# Patient Record
Sex: Female | Born: 1975 | Hispanic: No | Smoking: Never smoker
Health system: Southern US, Community
[De-identification: ages and names within clinical notes are randomized; demographics above are authoritative.]

---

## 2000-06-22 ENCOUNTER — Other Ambulatory Visit: Admission: RE | Admit: 2000-06-22 | Discharge: 2000-06-22 | Payer: Self-pay | Admitting: Family Medicine

## 2001-07-13 ENCOUNTER — Other Ambulatory Visit: Admission: RE | Admit: 2001-07-13 | Discharge: 2001-07-13 | Payer: Self-pay | Admitting: Family Medicine

## 2016-11-17 ENCOUNTER — Encounter (HOSPITAL_COMMUNITY): Payer: Self-pay | Admitting: Emergency Medicine

## 2016-11-17 ENCOUNTER — Emergency Department (HOSPITAL_COMMUNITY)
Admission: EM | Admit: 2016-11-17 | Discharge: 2016-11-18 | Disposition: A | Discharge status: Home / Self Care | Payer: No Typology Code available for payment source | Attending: Emergency Medicine | Admitting: Emergency Medicine

## 2016-11-17 ENCOUNTER — Emergency Department (HOSPITAL_COMMUNITY): Payer: No Typology Code available for payment source

## 2016-11-17 DIAGNOSIS — Y999 Unspecified external cause status: Secondary | ICD-10-CM | POA: Diagnosis not present

## 2016-11-17 DIAGNOSIS — S168XXA Other specified injury of muscle, fascia and tendon at neck level, initial encounter: Secondary | ICD-10-CM | POA: Diagnosis present

## 2016-11-17 DIAGNOSIS — S161XXA Strain of muscle, fascia and tendon at neck level, initial encounter: Secondary | ICD-10-CM | POA: Diagnosis not present

## 2016-11-17 DIAGNOSIS — R079 Chest pain, unspecified: Secondary | ICD-10-CM | POA: Diagnosis not present

## 2016-11-17 DIAGNOSIS — R109 Unspecified abdominal pain: Secondary | ICD-10-CM | POA: Insufficient documentation

## 2016-11-17 DIAGNOSIS — T07XXXA Unspecified multiple injuries, initial encounter: Secondary | ICD-10-CM

## 2016-11-17 DIAGNOSIS — Y92410 Unspecified street and highway as the place of occurrence of the external cause: Secondary | ICD-10-CM | POA: Insufficient documentation

## 2016-11-17 DIAGNOSIS — Y939 Activity, unspecified: Secondary | ICD-10-CM | POA: Diagnosis not present

## 2016-11-17 MED ORDER — NAPROXEN 250 MG PO TABS
500.0000 mg | ORAL_TABLET | Freq: Once | ORAL | Status: AC
  Administered 2016-11-17: 500 mg via ORAL
  Filled 2016-11-17: qty 2

## 2016-11-17 MED ORDER — OXYCODONE-ACETAMINOPHEN 5-325 MG PO TABS
1.0000 | ORAL_TABLET | Freq: Once | ORAL | Status: AC
  Administered 2016-11-17: 1 via ORAL

## 2016-11-17 MED ORDER — DIAZEPAM 5 MG PO TABS
5.0000 mg | ORAL_TABLET | Freq: Once | ORAL | Status: AC
  Administered 2016-11-17: 5 mg via ORAL
  Filled 2016-11-17: qty 1

## 2016-11-17 MED ORDER — OXYCODONE-ACETAMINOPHEN 5-325 MG PO TABS
ORAL_TABLET | ORAL | Status: AC
  Filled 2016-11-17: qty 1

## 2016-11-17 MED ORDER — HYDROCODONE-ACETAMINOPHEN 5-325 MG PO TABS
1.0000 | ORAL_TABLET | Freq: Once | ORAL | Status: AC
  Administered 2016-11-17: 1 via ORAL
  Filled 2016-11-17: qty 1
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
## 2016-11-18 ENCOUNTER — Emergency Department (HOSPITAL_COMMUNITY): Payer: No Typology Code available for payment source

## 2016-11-18 LAB — CBC WITH DIFFERENTIAL/PLATELET
Basophils Absolute: 0 K/uL (ref 0.0–0.1)
Basophils Relative: 0 %
Eosinophils Absolute: 0.3 K/uL (ref 0.0–0.7)
Eosinophils Relative: 4 %
HCT: 38.3 % (ref 36.0–46.0)
Hemoglobin: 12.4 g/dL (ref 12.0–15.0)
Lymphocytes Relative: 37 %
Lymphs Abs: 2.8 K/uL (ref 0.7–4.0)
MCH: 27.7 pg (ref 26.0–34.0)
MCHC: 32.4 g/dL (ref 30.0–36.0)
MCV: 85.7 fL (ref 78.0–100.0)
Monocytes Absolute: 0.5 K/uL (ref 0.1–1.0)
Monocytes Relative: 7 %
Neutro Abs: 3.8 K/uL (ref 1.7–7.7)
Neutrophils Relative %: 52 %
Platelets: 238 K/uL (ref 150–400)
RBC: 4.47 MIL/uL (ref 3.87–5.11)
RDW: 14.4 % (ref 11.5–15.5)
WBC: 7.5 K/uL (ref 4.0–10.5)

## 2016-11-18 LAB — COMPREHENSIVE METABOLIC PANEL
ALBUMIN: 3.9 g/dL (ref 3.5–5.0)
ALK PHOS: 77 U/L (ref 38–126)
ALT: 30 U/L (ref 14–54)
AST: 24 U/L (ref 15–41)
Anion gap: 10 (ref 5–15)
BILIRUBIN TOTAL: 0.6 mg/dL (ref 0.3–1.2)
BUN: 7 mg/dL (ref 6–20)
CALCIUM: 9 mg/dL (ref 8.9–10.3)
CO2: 24 mmol/L (ref 22–32)
CREATININE: 0.73 mg/dL (ref 0.44–1.00)
Chloride: 104 mmol/L (ref 101–111)
GFR calc Af Amer: >60 mL/min (ref >60)
GLUCOSE: 104 mg/dL — AB (ref 65–99)
POTASSIUM: 4 mmol/L (ref 3.5–5.1)
Sodium: 138 mmol/L (ref 135–145)
TOTAL PROTEIN: 7.2 g/dL (ref 6.5–8.1)

## 2016-11-18 LAB — I-STAT BETA HCG BLOOD, ED (MC, WL, AP ONLY)

## 2016-11-18 MED ORDER — METHOCARBAMOL 500 MG PO TABS: 500.0000 mg | ORAL_TABLET | Freq: Two times a day (BID) | ORAL | 0 refills | Status: AC

## 2016-11-18 MED ORDER — IBUPROFEN 600 MG PO TABS: 600.0000 mg | ORAL_TABLET | Freq: Four times a day (QID) | ORAL | 0 refills | Status: AC | PRN

## 2016-11-18 MED ORDER — HYDROCODONE-ACETAMINOPHEN 5-325 MG PO TABS
1.0000 | ORAL_TABLET | Freq: Once | ORAL | Status: AC
  Administered 2016-11-18: 1 via ORAL
  Filled 2016-11-18: qty 1

## 2016-11-18 MED ORDER — NAPROXEN 250 MG PO TABS
375.0000 mg | ORAL_TABLET | Freq: Once | ORAL | Status: AC
  Administered 2016-11-18: 375 mg via ORAL
  Filled 2016-11-18: qty 2

## 2016-11-18 NOTE — ED Notes (Signed)
Patient transported to X-ray 
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
## 2016-11-18 NOTE — ED Notes (Signed)
Reviewed d/c instructions with pt, who had no additional questions. No computer available to obtain signature. Pt departed in NAD, escorted in a wheelchair by this RN.
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
